# Patient Record
Sex: Male | Born: 2000 | Race: Black or African American | Hispanic: No | Marital: Single | Smoking: Current every day smoker
Health system: Southern US, Community
[De-identification: ages and names within clinical notes are randomized; demographics above are authoritative.]

---
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
## 2020-02-07 ENCOUNTER — Emergency Department (HOSPITAL_COMMUNITY)
Admission: EM | Admit: 2020-02-07 | Discharge: 2020-02-07 | Disposition: A | Discharge status: Home / Self Care | Payer: Medicaid Other | Attending: Emergency Medicine | Admitting: Emergency Medicine

## 2020-02-07 ENCOUNTER — Other Ambulatory Visit: Payer: Self-pay

## 2020-02-07 ENCOUNTER — Encounter (HOSPITAL_COMMUNITY): Payer: Self-pay

## 2020-02-07 DIAGNOSIS — L02214 Cutaneous abscess of groin: Secondary | ICD-10-CM | POA: Diagnosis not present

## 2020-02-07 MED ORDER — LIDOCAINE HCL 2 % IJ SOLN
INTRAMUSCULAR | Status: AC
  Administered 2020-02-07: 100 mg
  Filled 2020-02-07: qty 20

## 2020-02-07 MED ORDER — LIDOCAINE HCL 2 % IJ SOLN: 5.0000 mL | Freq: Once | INTRAMUSCULAR | Status: AC

## 2020-02-07 NOTE — ED Provider Notes (Signed)
[facility] Provider Note: Lowella Dell, MD, FACEP  CSN: [id number] MRN: [id number] ARRIVAL: 02/07/20 at 0020 ROOM: WA19/WA19   CHIEF COMPLAINT  Abscess   HISTORY OF PRESENT ILLNESS  02/07/20 1:18 AM John Hardy is a 19 y.o. male with a tender, swollen area of his left groin for the past 2 to 3 days.  He believes it is an abscess.  He rates associated pain is a 3 out of 10, worse with movement.  He has no history of abscesses in the past.  He denies fever or other systemic symptoms.   History reviewed. No pertinent past medical history.  History reviewed. No pertinent surgical history.  No family history on file.  Social History   Tobacco Use  . Smoking status: Never Smoker  . Smokeless tobacco: Never Used  Substance Use Topics  . Alcohol use: No  . Drug use: No    Prior to Admission medications   Not on File    Allergies Patient has no known allergies.   REVIEW OF SYSTEMS  Negative except as noted here or in the History of Present Illness.   PHYSICAL EXAMINATION  Initial Vital Signs Blood pressure 139/80, pulse 75, temperature 98.6 F (37 C), temperature source Oral, resp. rate 18, height 6\' 2"  (1.88 m), weight 90 kg, SpO2 100 %.  Examination General: Well-developed, well-nourished male in no acute distress; appearance consistent with age of record HENT: normocephalic; atraumatic Eyes: Normal appearance Neck: supple Heart: regular rate and rhythm Lungs: clear to auscultation bilaterally Abdomen: soft; nondistended; nontender; bowel sounds present Extremities: No deformity; full range of motion; pulses normal Neurologic: Awake, alert and oriented; motor function intact in all extremities and symmetric; no facial droop Skin: Warm and dry; multiloculated, fluctuant, tender lesion of left medial thigh just distal to the groin fold without scrotal involvement  Psychiatric: Normal mood and affect   RESULTS  Summary of this visit's  results, reviewed and interpreted by myself:   EKG Interpretation  Date/Time:    Ventricular Rate:    PR Interval:    QRS Duration:   QT Interval:    QTC Calculation:   R Axis:     Text Interpretation:        Laboratory Studies: No results found for this or any previous visit (from the past 24 hour(s)). Imaging Studies: No results found.  ED COURSE and MDM  Nursing notes, initial and subsequent vitals signs, including pulse oximetry, reviewed and interpreted by myself.  Vitals:   02/07/20 0027 02/07/20 0028  BP: 139/80   Pulse: 75   Resp: 18   Temp: 98.6 F (37 C)   TempSrc: Oral   SpO2: 100%   Weight:  90 kg  Height:  6\' 2"  (1.88 m)   Medications  lidocaine (XYLOCAINE) 2 % (with pres) injection (has no administration in time range)     PROCEDURES  Procedures  INCISION AND DRAINAGE Performed by: 02/09/20 Adiva Boettner Consent: Verbal consent obtained. Risks and benefits: risks, benefits and alternatives were discussed Type: abscess  Body area: Medial left thigh  Anesthesia: local infiltration  Incision was made with a scalpel x 2  Local anesthetic: lidocaine 2% without epinephrine  Anesthetic total: 3 ml  Complexity: complex Blunt dissection to break up loculations  Drainage: purulent  Drainage amount: Moderate  Packing material: None  Patient tolerance: Patient tolerated the procedure well with no immediate complications.   ED DIAGNOSES     ICD-10-CM   1. Abscess of left groin  D63.875        Paula Libra, MD 02/07/20 [phone]

## 2020-02-07 NOTE — ED Triage Notes (Signed)
Pt reports abscess on left groin x 2-3 days.

## 2020-02-07 NOTE — ED Notes (Signed)
Pt has 2 x abscess noted to left groin area.pt states it is painful and has been there for a few days

## 2020-02-07 NOTE — ED Notes (Signed)
Bandaged placed over abscess

## 2021-01-27 ENCOUNTER — Other Ambulatory Visit: Payer: Self-pay

## 2021-01-27 ENCOUNTER — Emergency Department (HOSPITAL_COMMUNITY): Payer: Medicaid Other

## 2021-01-27 ENCOUNTER — Emergency Department (HOSPITAL_COMMUNITY)
Admission: EM | Admit: 2021-01-27 | Discharge: 2021-01-27 | Disposition: A | Discharge status: Home / Self Care | Payer: Medicaid Other | Attending: Emergency Medicine | Admitting: Emergency Medicine

## 2021-01-27 ENCOUNTER — Encounter (HOSPITAL_COMMUNITY): Payer: Self-pay

## 2021-01-27 DIAGNOSIS — R519 Headache, unspecified: Secondary | ICD-10-CM | POA: Diagnosis not present

## 2021-01-27 DIAGNOSIS — R911 Solitary pulmonary nodule: Secondary | ICD-10-CM | POA: Diagnosis not present

## 2021-01-27 DIAGNOSIS — R55 Syncope and collapse: Secondary | ICD-10-CM | POA: Diagnosis not present

## 2021-01-27 DIAGNOSIS — I10 Essential (primary) hypertension: Secondary | ICD-10-CM | POA: Diagnosis not present

## 2021-01-27 DIAGNOSIS — R0789 Other chest pain: Secondary | ICD-10-CM | POA: Insufficient documentation

## 2021-01-27 DIAGNOSIS — Q7649 Other congenital malformations of spine, not associated with scoliosis: Secondary | ICD-10-CM | POA: Diagnosis not present

## 2021-01-27 DIAGNOSIS — M542 Cervicalgia: Secondary | ICD-10-CM | POA: Diagnosis not present

## 2021-01-27 DIAGNOSIS — R079 Chest pain, unspecified: Secondary | ICD-10-CM | POA: Diagnosis not present

## 2021-01-27 DIAGNOSIS — M79603 Pain in arm, unspecified: Secondary | ICD-10-CM | POA: Diagnosis not present

## 2021-01-27 DIAGNOSIS — R109 Unspecified abdominal pain: Secondary | ICD-10-CM | POA: Diagnosis not present

## 2021-01-27 DIAGNOSIS — K6389 Other specified diseases of intestine: Secondary | ICD-10-CM | POA: Diagnosis not present

## 2021-01-27 LAB — COMPREHENSIVE METABOLIC PANEL
ALT: 23 U/L (ref 0–44)
AST: 25 U/L (ref 15–41)
Albumin: 4.2 g/dL (ref 3.5–5.0)
Alkaline Phosphatase: 57 U/L (ref 38–126)
Anion gap: 9 (ref 5–15)
BUN: 13 mg/dL (ref 6–20)
CO2: 24 mmol/L (ref 22–32)
Calcium: 9.3 mg/dL (ref 8.9–10.3)
Chloride: 105 mmol/L (ref 98–111)
Creatinine, Ser: 0.94 mg/dL (ref 0.61–1.24)
GFR, Estimated: >60 mL/min (ref >60)
Glucose, Bld: 96 mg/dL (ref 70–99)
Potassium: 4.5 mmol/L (ref 3.5–5.1)
Sodium: 138 mmol/L (ref 135–145)
Total Bilirubin: 0.5 mg/dL (ref 0.3–1.2)
Total Protein: 8.2 g/dL — ABNORMAL HIGH (ref 6.5–8.1)

## 2021-01-27 LAB — CBC
HCT: 52 % (ref 39.0–52.0)
Hemoglobin: 16.9 g/dL (ref 13.0–17.0)
MCH: 27.9 pg (ref 26.0–34.0)
MCHC: 32.5 g/dL (ref 30.0–36.0)
MCV: 85.8 fL (ref 80.0–100.0)
Platelets: 166 10*3/uL (ref 150–400)
RBC: 6.06 MIL/uL — ABNORMAL HIGH (ref 4.22–5.81)
RDW: 13.4 % (ref 11.5–15.5)
WBC: 7.8 10*3/uL (ref 4.0–10.5)
nRBC: 0 % (ref 0.0–0.2)

## 2021-01-27 MED ORDER — METHOCARBAMOL 500 MG PO TABS: 500.0000 mg | ORAL_TABLET | Freq: Three times a day (TID) | ORAL | 0 refills | Status: DC | PRN

## 2021-01-27 MED ORDER — ONDANSETRON HCL 4 MG/2ML IJ SOLN
4.0000 mg | Freq: Once | INTRAMUSCULAR | Status: AC
  Administered 2021-01-27: 4 mg via INTRAVENOUS
  Filled 2021-01-27: qty 2

## 2021-01-27 MED ORDER — MORPHINE SULFATE (PF) 4 MG/ML IV SOLN
4.0000 mg | Freq: Once | INTRAVENOUS | Status: AC
  Administered 2021-01-27: 4 mg via INTRAVENOUS
  Filled 2021-01-27: qty 1

## 2021-01-27 MED ORDER — NAPROXEN 500 MG PO TABS: 500.0000 mg | ORAL_TABLET | Freq: Two times a day (BID) | ORAL | 0 refills | Status: DC | PRN

## 2021-01-27 MED ORDER — IOHEXOL 350 MG/ML SOLN
80.0000 mL | Freq: Once | INTRAVENOUS | Status: AC | PRN
  Administered 2021-01-27: 80 mL via INTRAVENOUS
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
## 2021-05-01 ENCOUNTER — Emergency Department (HOSPITAL_BASED_OUTPATIENT_CLINIC_OR_DEPARTMENT_OTHER): Admit: 2021-05-01 | Payer: No Typology Code available for payment source

## 2021-05-01 ENCOUNTER — Encounter (HOSPITAL_BASED_OUTPATIENT_CLINIC_OR_DEPARTMENT_OTHER): Payer: Self-pay

## 2021-05-01 ENCOUNTER — Other Ambulatory Visit: Payer: Self-pay

## 2021-05-01 ENCOUNTER — Emergency Department (HOSPITAL_BASED_OUTPATIENT_CLINIC_OR_DEPARTMENT_OTHER)
Admission: EM | Admit: 2021-05-01 | Discharge: 2021-05-01 | Disposition: A | Discharge status: Home / Self Care | Payer: Medicaid Other | Attending: Emergency Medicine | Admitting: Emergency Medicine

## 2021-05-01 DIAGNOSIS — M545 Low back pain, unspecified: Secondary | ICD-10-CM | POA: Insufficient documentation

## 2021-05-01 DIAGNOSIS — G4489 Other headache syndrome: Secondary | ICD-10-CM | POA: Diagnosis not present

## 2021-05-01 DIAGNOSIS — M5459 Other low back pain: Secondary | ICD-10-CM | POA: Diagnosis not present

## 2021-05-01 MED ORDER — KETOROLAC TROMETHAMINE 60 MG/2ML IM SOLN
30.0000 mg | Freq: Once | INTRAMUSCULAR | Status: AC
  Administered 2021-05-01: 30 mg via INTRAMUSCULAR
  Filled 2021-05-01: qty 2

## 2021-05-01 MED ORDER — MELOXICAM 7.5 MG PO TABS: 7.5000 mg | ORAL_TABLET | Freq: Every day | ORAL | 0 refills | Status: DC
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
## 2021-05-06 DIAGNOSIS — H538 Other visual disturbances: Secondary | ICD-10-CM | POA: Diagnosis not present
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
## 2023-01-13 DIAGNOSIS — S96911A Strain of unspecified muscle and tendon at ankle and foot level, right foot, initial encounter: Secondary | ICD-10-CM | POA: Diagnosis not present

## 2023-01-13 DIAGNOSIS — M722 Plantar fascial fibromatosis: Secondary | ICD-10-CM | POA: Diagnosis not present

## 2023-02-09 ENCOUNTER — Ambulatory Visit: Payer: 59 | Admitting: Podiatry

## 2023-10-10 ENCOUNTER — Ambulatory Visit: Payer: Self-pay

## 2023-10-10 ENCOUNTER — Encounter (HOSPITAL_BASED_OUTPATIENT_CLINIC_OR_DEPARTMENT_OTHER): Payer: Self-pay | Admitting: Emergency Medicine

## 2023-10-10 ENCOUNTER — Other Ambulatory Visit: Payer: Self-pay

## 2023-10-10 ENCOUNTER — Emergency Department (HOSPITAL_BASED_OUTPATIENT_CLINIC_OR_DEPARTMENT_OTHER)
Admission: EM | Admit: 2023-10-10 | Discharge: 2023-10-10 | Disposition: A | Discharge status: Home / Self Care | Attending: Emergency Medicine | Admitting: Emergency Medicine

## 2023-10-10 DIAGNOSIS — Z202 Contact with and (suspected) exposure to infections with a predominantly sexual mode of transmission: Secondary | ICD-10-CM | POA: Insufficient documentation

## 2023-10-10 DIAGNOSIS — R600 Localized edema: Secondary | ICD-10-CM | POA: Insufficient documentation

## 2023-10-10 DIAGNOSIS — R609 Edema, unspecified: Secondary | ICD-10-CM

## 2023-10-10 NOTE — ED Provider Notes (Signed)
 [place] EMERGENCY DEPARTMENT AT [facility] Provider Note   CSN: [id number] Arrival date & time: 10/10/23  9062     Patient presents with: SEXUALLY TRANSMITTED DISEASE   John Hardy is a 23 y.o. male presenting to ED for STI testing.  Patient reports he had 3 male partners sexual intercourse at the start of the year. He denies any discharge or lesions or symptoms.  Wants to get screened   Reports also chronic edema in both legs.  Eats lots of salt, works on his feet   HPI     Prior to Admission medications   Medication Sig Start Date End Date Taking? Authorizing Provider  meloxicam  (MOBIC ) 7.5 MG tablet Take 1 tablet (7.5 mg total) by mouth daily. 05/01/21   Mesner, Selinda, MD  methocarbamol  (ROBAXIN ) 500 MG tablet Take 1 tablet (500 mg total) by mouth every 8 (eight) hours as needed for muscle spasms. 01/27/21   Petrucelli, Samantha R, PA-C  naproxen  (NAPROSYN ) 500 MG tablet Take 1 tablet (500 mg total) by mouth 2 (two) times daily as needed for moderate pain. 01/27/21   Petrucelli, Samantha R, PA-C    Allergies: Patient has no known allergies.    Review of Systems  Updated Vital Signs BP (!) 141/75 (BP Location: Left Arm)   Pulse 76   Temp 98.1 F (36.7 C) (Oral)   Resp 18   SpO2 99%   Physical Exam Constitutional:      General: He is not in acute distress. HENT:     Head: Normocephalic and atraumatic.   Eyes:     Conjunctiva/sclera: Conjunctivae normal.     Pupils: Pupils are equal, round, and reactive to light.    Cardiovascular:     Rate and Rhythm: Normal rate and regular rhythm.  Pulmonary:     Effort: Pulmonary effort is normal. No respiratory distress.  Abdominal:     General: There is no distension.     Tenderness: There is no abdominal tenderness.   Musculoskeletal:     Right lower leg: Edema present.     Left lower leg: Edema present.   Skin:    General: Skin is warm and dry.   Neurological:     General: No focal  deficit present.     Mental Status: He is alert. Mental status is at baseline.   Psychiatric:        Mood and Affect: Mood normal.        Behavior: Behavior normal.     (all labs ordered are listed, but only abnormal results are displayed) Labs Reviewed  GC/CHLAMYDIA PROBE AMP (Paden) NOT AT [facility]    EKG: None  Radiology: No results found.   Procedures   Medications Ordered in the ED - No data to display                                  Medical Decision Making  Routine STI testing, asymptomatic Will follow up on results outpatient Don't think he needs empiric treatment today  Low risk for HIV/syphilis exposure  Advised establishing care with PCP for other medical concerns - edema appears chronic.  Doubt infection or DVT.  Suspect this is related to lymphedema and high salt intake.  Discussed compression stockings.  Doubt CHF, PE.     Final diagnoses:  Edema, unspecified type  Possible exposure to STI    ED Discharge Orders  None          Cottie Donnice PARAS, MD 10/10/23 [phone]

## 2023-10-10 NOTE — Discharge Instructions (Addendum)
 Please establish care with a new primary care office for your medical concerns and medical checkups.  *  Your STI testing will be available on your patient portal in 3-5 days.  If you test positive, you will likely be called and told to get specific treatment.  Do not have sex until you know your results and have finished treatment, if needed.    If you test positive for an STI, please contact all your prior sexual partners and tell them they need to be tested.

## 2023-10-10 NOTE — ED Triage Notes (Signed)
 States unprotected sex recently and requesting STD check. Denies symptoms.

## 2023-10-10 NOTE — ED Notes (Signed)
 DC paperwork given and verbally understood.

## 2023-10-10 NOTE — Telephone Encounter (Signed)
 FYI Only or Action Required?: FYI only for provider.  Patient was last seen in primary care on not established. Called Nurse Triage reporting Foot Pain. Symptoms began several months ago. Interventions attempted: Nothing. Symptoms are: gradually worsening.  Triage Disposition: See PCP Within 2 Weeks  Patient/caregiver understands and will follow disposition?: Yes    Copied from CRM [id number]. Topic: Clinical - Red Word Triage >> Oct 10, 2023 10:35 AM Charlet HERO wrote: Red Word that prompted transfer to Nurse Triage: The patient is calling with swollen feet and pain, it is both swollen only the right has pain and redness. He is looking for a primary. He is stating that he went to urgent care and  was advised to get pcp,he is stating that he can't move or walk and he wakes up and they are very swollen. Reason for Disposition  Foot pain is a chronic symptom (recurrent or ongoing AND present > 4 weeks)  Answer Assessment - Initial Assessment Questions 1. ONSET: When did the pain start?      6-7 months ago 2. LOCATION: Where is the pain located?      Right foot but is not a constant pain, it comes and goes.  3. PAIN: How bad is the pain?    (Scale 1-10; or mild, moderate, severe)  - MILD (1-3): doesn't interfere with normal activities.   - MODERATE (4-7): interferes with normal activities (e.g., work or school) or awakens from sleep, limping.   - SEVERE (8-10): excruciating pain, unable to do any normal activities, unable to walk.      7/10 right now 4. WORK OR EXERCISE: Has there been any recent work or exercise that involved this part of the body?      no 5. CAUSE: What do you think is causing the foot pain?     unknown 6. OTHER SYMPTOMS: Do you have any other symptoms? (e.g., leg pain, rash, fever, numbness)     swelling  Protocols used: Foot Pain-A-AH

## 2023-10-11 LAB — GC/CHLAMYDIA PROBE AMP (~~LOC~~) NOT AT ARMC
Chlamydia: NEGATIVE
Comment: NEGATIVE
Comment: NORMAL
Neisseria Gonorrhea: NEGATIVE

## 2023-10-20 ENCOUNTER — Ambulatory Visit (INDEPENDENT_AMBULATORY_CARE_PROVIDER_SITE_OTHER): Admitting: Family Medicine

## 2023-10-20 ENCOUNTER — Encounter (HOSPITAL_BASED_OUTPATIENT_CLINIC_OR_DEPARTMENT_OTHER): Payer: Self-pay | Admitting: Family Medicine

## 2023-10-20 VITALS — BP 133/77 | HR 74 | Ht 72.0 in | Wt 207.0 lb

## 2023-10-20 DIAGNOSIS — Z0000 Encounter for general adult medical examination without abnormal findings: Secondary | ICD-10-CM

## 2023-10-20 DIAGNOSIS — M7989 Other specified soft tissue disorders: Secondary | ICD-10-CM | POA: Insufficient documentation

## 2023-10-20 NOTE — Patient Instructions (Signed)
  Medication Instructions:  Your physician recommends that you continue on your current medications as directed. Please refer to the Current Medication list given to you today. --If you need a refill on any your medications before your next appointment, please call your pharmacy first. If no refills are authorized on file call the office.-- Lab Work: Your physician has recommended that you have lab work today: fasting when you are able  If you have labs (blood work) drawn today and your tests are completely normal, you will receive your results via MyChart message OR a phone call from our staff.  Please ensure you check your voicemail in the event that you authorized detailed messages to be left on a delegated number. If you have any lab test that is abnormal or we need to change your treatment, we will call you to review the results.   Follow-Up: Your next appointment:   Your physician recommends that you schedule a follow-up appointment in: 4- 6 week physical  with Dr. de Peru  You will receive a text message or e-mail with a link to a survey about your care and experience with us  today! We would greatly appreciate your feedback!   Thanks for letting us  be apart of your health journey!!  Primary Care and Sports Medicine   Dr. Quintin sheerer Peru   We encourage you to activate your patient portal called MyChart.  Sign up information is provided on this After Visit Summary.  MyChart is used to connect with patients for Virtual Visits (Telemedicine).  Patients are able to view lab/test results, encounter notes, upcoming appointments, etc.  Non-urgent messages can be sent to your provider as well. To learn more about what you can do with MyChart, please visit --  [email or web address].

## 2023-10-20 NOTE — Progress Notes (Signed)
 New Patient Office Visit  Subjective   Patient ID: John Hardy, male    DOB: 2000-07-04  Age: 23 y.o. MRN: [id number]  CC:  Chief Complaint  Patient presents with   New Patient (Initial Visit)    New patient has excessive swelling in feet and pain has not had pcp in awhile     HPI John Hardy presents to establish care Last PCP - pediatrician  Leg and feet swelling: present for about a year. Has been progressive. Swelling persists throughout the day. Reports notable swelling in the morning that will persist during the day. Did make dietary changes, reduced processed foods, salt in the diet. No significant pain. Has had ED evaluation - no labs or imaging. No temperature changes noted. Has tried elevating, some improvement with this.  Patient is originally from [place]. He works at [employer]. He enjoys driving, always on his feet.  Outpatient Encounter Medications as of 10/20/2023  Medication Sig   [DISCONTINUED] meloxicam  (MOBIC ) 7.5 MG tablet Take 1 tablet (7.5 mg total) by mouth daily.   [DISCONTINUED] methocarbamol  (ROBAXIN ) 500 MG tablet Take 1 tablet (500 mg total) by mouth every 8 (eight) hours as needed for muscle spasms.   [DISCONTINUED] naproxen  (NAPROSYN ) 500 MG tablet Take 1 tablet (500 mg total) by mouth 2 (two) times daily as needed for moderate pain.   No facility-administered encounter medications on file as of 10/20/2023.    History reviewed. No pertinent past medical history.  History reviewed. No pertinent surgical history.  History reviewed. No pertinent family history.  Social History   Socioeconomic History   Marital status: Single    Spouse name: Not on file   Number of children: Not on file   Years of education: Not on file   Highest education level: Not on file  Occupational History   Not on file  Tobacco Use   Smoking status: Every Day    Types: Cigarettes    Passive exposure: Current   Smokeless tobacco: Never   Vaping Use   Vaping status: Never Used  Substance and Sexual Activity   Alcohol use: Not Currently   Drug use: Not Currently   Sexual activity: Not on file  Other Topics Concern   Not on file  Social History Narrative   Not on file   Social Drivers of Health   Financial Resource Strain: Not on file  Food Insecurity: Not on file  Transportation Needs: Not on file  Physical Activity: Not on file  Stress: Not on file  Social Connections: Not on file  Intimate Partner Violence: Not on file    Objective   BP 133/77 (BP Location: Right Arm, Patient Position: Sitting, Cuff Size: Normal)   Pulse 74   Ht 6' (1.829 m)   Wt 207 lb (93.9 kg)   SpO2 98%   BMI 28.07 kg/m   Physical Exam  23 year old male in no acute distress Cardiovascular exam with regular rate and rhythm, no murmur appreciated Lungs clear to auscultation bilaterally Bilateral lower extremities with 1+ pitting edema.  Distal neurovascular exam intact, normal PT and DP pulses bilaterally.  Assessment & Plan:   Swelling of lower extremity Assessment & Plan: No obvious cause for observed swelling.  Patient is of relatively young age to have degree of swelling observed.  At this time, we can proceed with labs to assess for any metabolic issues.  We will also proceed with referral to vascular surgery for further evaluation and recommendations  Orders: -  Ambulatory referral to Vascular Surgery  Wellness examination -     CBC with Differential/Platelet; Future -     Comprehensive metabolic panel with GFR; Future -     Hemoglobin A1c; Future -     Lipid panel; Future -     TSH Rfx on Abnormal to Free T4; Future  Return in about 4 weeks (around 11/17/2023) for CPE with fasting labs 1 week prior.    ___________________________________________ Caprisha Bridgett de Peru, MD, ABFM, CAQSM Primary Care and Sports Medicine [facility]

## 2023-10-20 NOTE — Assessment & Plan Note (Signed)
 No obvious cause for observed swelling.  Patient is of relatively young age to have degree of swelling observed.  At this time, we can proceed with labs to assess for any metabolic issues.  We will also proceed with referral to vascular surgery for further evaluation and recommendations

## 2023-10-24 ENCOUNTER — Other Ambulatory Visit: Payer: Self-pay

## 2023-10-24 DIAGNOSIS — M7989 Other specified soft tissue disorders: Secondary | ICD-10-CM

## 2023-11-03 ENCOUNTER — Ambulatory Visit (HOSPITAL_COMMUNITY): Admission: RE | Admit: 2023-11-03 | Source: Ambulatory Visit

## 2023-11-12 ENCOUNTER — Encounter (HOSPITAL_BASED_OUTPATIENT_CLINIC_OR_DEPARTMENT_OTHER): Payer: Self-pay | Admitting: Emergency Medicine

## 2023-11-12 ENCOUNTER — Emergency Department (HOSPITAL_BASED_OUTPATIENT_CLINIC_OR_DEPARTMENT_OTHER)
Admission: EM | Admit: 2023-11-12 | Discharge: 2023-11-13 | Disposition: A | Discharge status: Home / Self Care | Attending: Emergency Medicine | Admitting: Emergency Medicine

## 2023-11-12 DIAGNOSIS — H01002 Unspecified blepharitis right lower eyelid: Secondary | ICD-10-CM | POA: Diagnosis not present

## 2023-11-12 DIAGNOSIS — H5711 Ocular pain, right eye: Secondary | ICD-10-CM | POA: Diagnosis not present

## 2023-11-12 MED ORDER — ERYTHROMYCIN 5 MG/GM OP OINT
TOPICAL_OINTMENT | Freq: Once | OPHTHALMIC | Status: AC
  Filled 2023-11-12: qty 3.5

## 2023-11-12 NOTE — ED Provider Notes (Incomplete)
  [place] EMERGENCY DEPARTMENT AT [facility] Provider Note   CSN: [id number] Arrival date & time: 11/12/23  2035     Patient presents with: Eye Problem   John Hardy is a 23 y.o. male who presents emergency department with a chief complaint of right lower eyelid swelling.  {Add pertinent medical, surgical, social history, OB history to YEP:67052}  Eye Problem      Prior to Admission medications   Not on File    Allergies: Patient has no known allergies.    Review of Systems  Updated Vital Signs BP (!) 144/72 (BP Location: Right Arm)   Pulse 81   Temp 98.2 F (36.8 C) (Oral)   Resp 18   SpO2 100%   Physical Exam  (all labs ordered are listed, but only abnormal results are displayed) Labs Reviewed - No data to display  EKG: None  Radiology: No results found.  {Document cardiac monitor, telemetry assessment procedure when appropriate:32947} Procedures   Medications Ordered in the ED - No data to display    {Click here for ABCD2, HEART and other calculators REFRESH Note before signing:1}                              Medical Decision Making  ***  {Document critical care time when appropriate  Document review of labs and clinical decision tools ie CHADS2VASC2, etc  Document your independent review of radiology images and any outside records  Document your discussion with family members, caretakers and with consultants  Document social determinants of health affecting pt's care  Document your decision making why or why not admission, treatments were needed:32947:::1}   Final diagnoses:  None    ED Discharge Orders     None

## 2023-11-12 NOTE — ED Provider Notes (Signed)
 [facility] AT [facility] Provider Note   CSN: [id number] Arrival date & time: 11/12/23  2035     Patient presents with: Eye Problem   John Hardy is a 23 y.o. male who presents emergency department with a chief complaint of right lower eyelid swelling.  Patient states that he got home from work at approximately 12:30 PM today and took a nap until roughly 5 PM.  Patient states that when he woke up he noticed that his right eye was a little painful.  Patient states that he looked in the mirror and noticed that the lower eyelid portion was swollen and red.  Denies visual disturbances.  Patient does appreciate ear drainage.  Patient states that the area is tender to touch.  Denies symptoms like this ever before, denies sick contacts.  Patient denies significant past medical history    Eye Problem      Prior to Admission medications   Medication Sig Start Date End Date Taking? Authorizing Provider  erythromycin  ophthalmic ointment Place a 1/2 inch ribbon of ointment into the lower eyelid. 11/13/23  Yes Lainie Daubert F, PA-C    Allergies: Patient has no known allergies.    Review of Systems  Eyes:  Positive for pain.    Updated Vital Signs BP (!) 112/58   Pulse 66   Temp 98.2 F (36.8 C) (Oral)   Resp 18   SpO2 97%   Physical Exam Vitals and nursing note reviewed.  Constitutional:      General: He is awake. He is not in acute distress.    Appearance: Normal appearance. He is not ill-appearing, toxic-appearing or diaphoretic.  HENT:     Head: Normocephalic and atraumatic.  Eyes:     General: Lids are everted, no foreign bodies appreciated. Vision grossly intact. Gaze aligned appropriately. No scleral icterus.    Extraocular Movements: Extraocular movements intact.     Conjunctiva/sclera: Conjunctivae normal.     Pupils: Pupils are equal, round, and reactive to light.     Comments: Significant swelling present to right lower eyelid,  erythematous, no obvious head, no drainage  Pulmonary:     Effort: Pulmonary effort is normal. No respiratory distress.  Musculoskeletal:        General: Normal range of motion.     Cervical back: Normal range of motion.     Right lower leg: No edema.     Left lower leg: No edema.  Skin:    General: Skin is warm.     Capillary Refill: Capillary refill takes less than 2 seconds.  Neurological:     General: No focal deficit present.     Mental Status: He is alert and oriented to person, place, and time.  Psychiatric:        Mood and Affect: Mood normal.        Behavior: Behavior normal. Behavior is cooperative.     (all labs ordered are listed, but only abnormal results are displayed) Labs Reviewed - No data to display  EKG: None  Radiology: No results found.   Procedures   Medications Ordered in the ED  erythromycin  ophthalmic ointment ( Right Eye Given 11/13/23 0009)                                    Medical Decision Making Risk Prescription drug management.   Patient presents to the ED for concern of right  lower eyelid swelling, this involves an extensive number of treatment options, and is a complaint that carries with it a high risk of complications and morbidity.  The differential diagnosis includes stye, chilis EN, blepharitis, orbital cellulitis, laceration, trauma/injury, corneal abrasion, glaucoma, etc.   Co morbidities that complicate the patient evaluation  None   Medicines ordered and prescription drug management:  I ordered medication including erythromycin  ointment for possible blepharitis Reevaluation of the patient after these medicines showed that the patient improved I have reviewed the patients home medicines and have made adjustments as needed   Test Considered:  CBC: Declined at this time as patient has no visual disturbances, denies systemic symptoms, no fever, no chills, vital signs stable, although swelling and erythema are present, I  do not believe that a CBC is clinically relevant at this time or would impact treatment Fluorescein stain: Declined at this time as conjunctiva of eye appears normal, no foreign bodies appreciated, no injection of right eye, patient denies known event where foreign body went into eye, which would cause corneal abrasion   Critical Interventions:  none   Problem List / ED Course:  23 year old male, right lower eyelid redness and swelling, started at approximately 5 PM today, vital signs stable, no systemic symptoms, no visual disturbances On physical exam right lower eyelid swollen and erythematous, conjunctiva look normal, lids everted, no foreign bodies identified, vision grossly intact, pupils equal and reactive to light Concern for blepharitis based off of presentation, patient instructed to continue to use erythromycin  ointment which was prescribed by myself as well as warm compresses Instructed to continue to monitor symptoms and return if symptoms persist or worsen Return precautions given Patient discharged Most likely diagnosis at this time is possible right lower eyelid blepharitis, patient treated with outpatient erythromycin  ointment and instructed to use supportive care including warm compresses as well as keep the eyelid clean.   Reevaluation:  After the interventions noted above, I reevaluated the patient and found that they have :improved   Social Determinants of Health:  none   Dispostion:  After consideration of the diagnostic results and the patients response to treatment, I feel that the patent would benefit from discharge and outpatient therapy as prescribed.  If symptoms persist or worsen recommend follow-up with primary care provider within 48 hours..       Final diagnoses:  Blepharitis of right lower eyelid, unspecified type    ED Discharge Orders          Ordered    erythromycin  ophthalmic ointment        11/13/23 0006                Janetta Terrall FALCON, PA-C 11/13/23 0152    Armenta Canning, MD 11/16/23 1010

## 2023-11-12 NOTE — ED Triage Notes (Signed)
 Right eye irritation and swelling, drainage Noticed today  No vision change

## 2023-11-13 DIAGNOSIS — H01002 Unspecified blepharitis right lower eyelid: Secondary | ICD-10-CM | POA: Diagnosis not present

## 2023-11-13 MED ORDER — ERYTHROMYCIN 5 MG/GM OP OINT: TOPICAL_OINTMENT | OPHTHALMIC | 0 refills | Status: AC

## 2023-11-13 NOTE — Discharge Instructions (Addendum)
 It was a pleasure taking care of you today.  Based on your history and physical exam I feel you are safe for discharge.  Please continue to monitor your symptoms and use the prescribed antibiotic ointment to help with your symptoms.  Please also continue supportive care including warm compresses to the inflamed area.  If you experience any of the following symptoms including but not limited to visual disturbances, blurry vision, increasing redness, increasing swelling, increasing drainage, or other concerning symptom please return to the emergency department or seek further medical care. If symptoms persist or worsen recommend follow-up with primary care in 48 hours.

## 2023-11-17 ENCOUNTER — Encounter (HOSPITAL_BASED_OUTPATIENT_CLINIC_OR_DEPARTMENT_OTHER): Admitting: Family Medicine

## 2023-11-20 ENCOUNTER — Ambulatory Visit (HOSPITAL_COMMUNITY)
Admission: RE | Admit: 2023-11-20 | Discharge: 2023-11-20 | Disposition: A | Discharge status: Home / Self Care | Source: Ambulatory Visit | Attending: Vascular Surgery | Admitting: Vascular Surgery

## 2023-11-20 DIAGNOSIS — M7989 Other specified soft tissue disorders: Secondary | ICD-10-CM | POA: Diagnosis not present

## 2023-11-21 ENCOUNTER — Ambulatory Visit: Attending: Surgery | Admitting: Physician Assistant

## 2023-11-21 ENCOUNTER — Encounter: Payer: Self-pay | Admitting: Physician Assistant

## 2023-11-21 VITALS — BP 113/75 | HR 77 | Temp 98.0°F | Wt 210.5 lb

## 2023-11-21 DIAGNOSIS — M79604 Pain in right leg: Secondary | ICD-10-CM | POA: Diagnosis not present

## 2023-11-21 DIAGNOSIS — I872 Venous insufficiency (chronic) (peripheral): Secondary | ICD-10-CM
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
## 2024-04-09 ENCOUNTER — Encounter (HOSPITAL_BASED_OUTPATIENT_CLINIC_OR_DEPARTMENT_OTHER): Admitting: Family Medicine

## 2024-06-06 ENCOUNTER — Encounter (HOSPITAL_BASED_OUTPATIENT_CLINIC_OR_DEPARTMENT_OTHER): Admitting: Family Medicine
